# Patient Record
Sex: Male | Born: 1985 | Race: White | Hispanic: No | Marital: Married | Smoking: Never smoker
Health system: Southern US, Community
[De-identification: ages and names within clinical notes are randomized; demographics above are authoritative.]

---

## 2018-09-02 ENCOUNTER — Other Ambulatory Visit: Payer: Self-pay

## 2018-09-02 ENCOUNTER — Other Ambulatory Visit: Payer: Self-pay | Admitting: Family Medicine

## 2018-09-02 ENCOUNTER — Ambulatory Visit
Admission: RE | Admit: 2018-09-02 | Discharge: 2018-09-02 | Disposition: A | Discharge status: Home / Self Care | Payer: Federal, State, Local not specified - PPO | Source: Ambulatory Visit | Attending: Family Medicine | Admitting: Family Medicine

## 2018-09-02 ENCOUNTER — Ambulatory Visit
Admission: RE | Admit: 2018-09-02 | Discharge: 2018-09-02 | Disposition: A | Discharge status: Home / Self Care | Payer: Federal, State, Local not specified - PPO | Attending: Family Medicine | Admitting: Family Medicine

## 2018-09-02 DIAGNOSIS — G8929 Other chronic pain: Secondary | ICD-10-CM

## 2018-09-02 DIAGNOSIS — M25562 Pain in left knee: Secondary | ICD-10-CM

## 2020-11-29 IMAGING — CR LEFT KNEE - 1-2 VIEW
2 series · 2 of 2 positions shown · non-contrast
Comparison: None.

CLINICAL DATA: Chronic left knee pain for the past 20 years, worse
during the past 3 years especially since beginning new job and with
recent weight gain. No known injury.

EXAM:
LEFT KNEE - 1-2 VIEW

[knee ap]
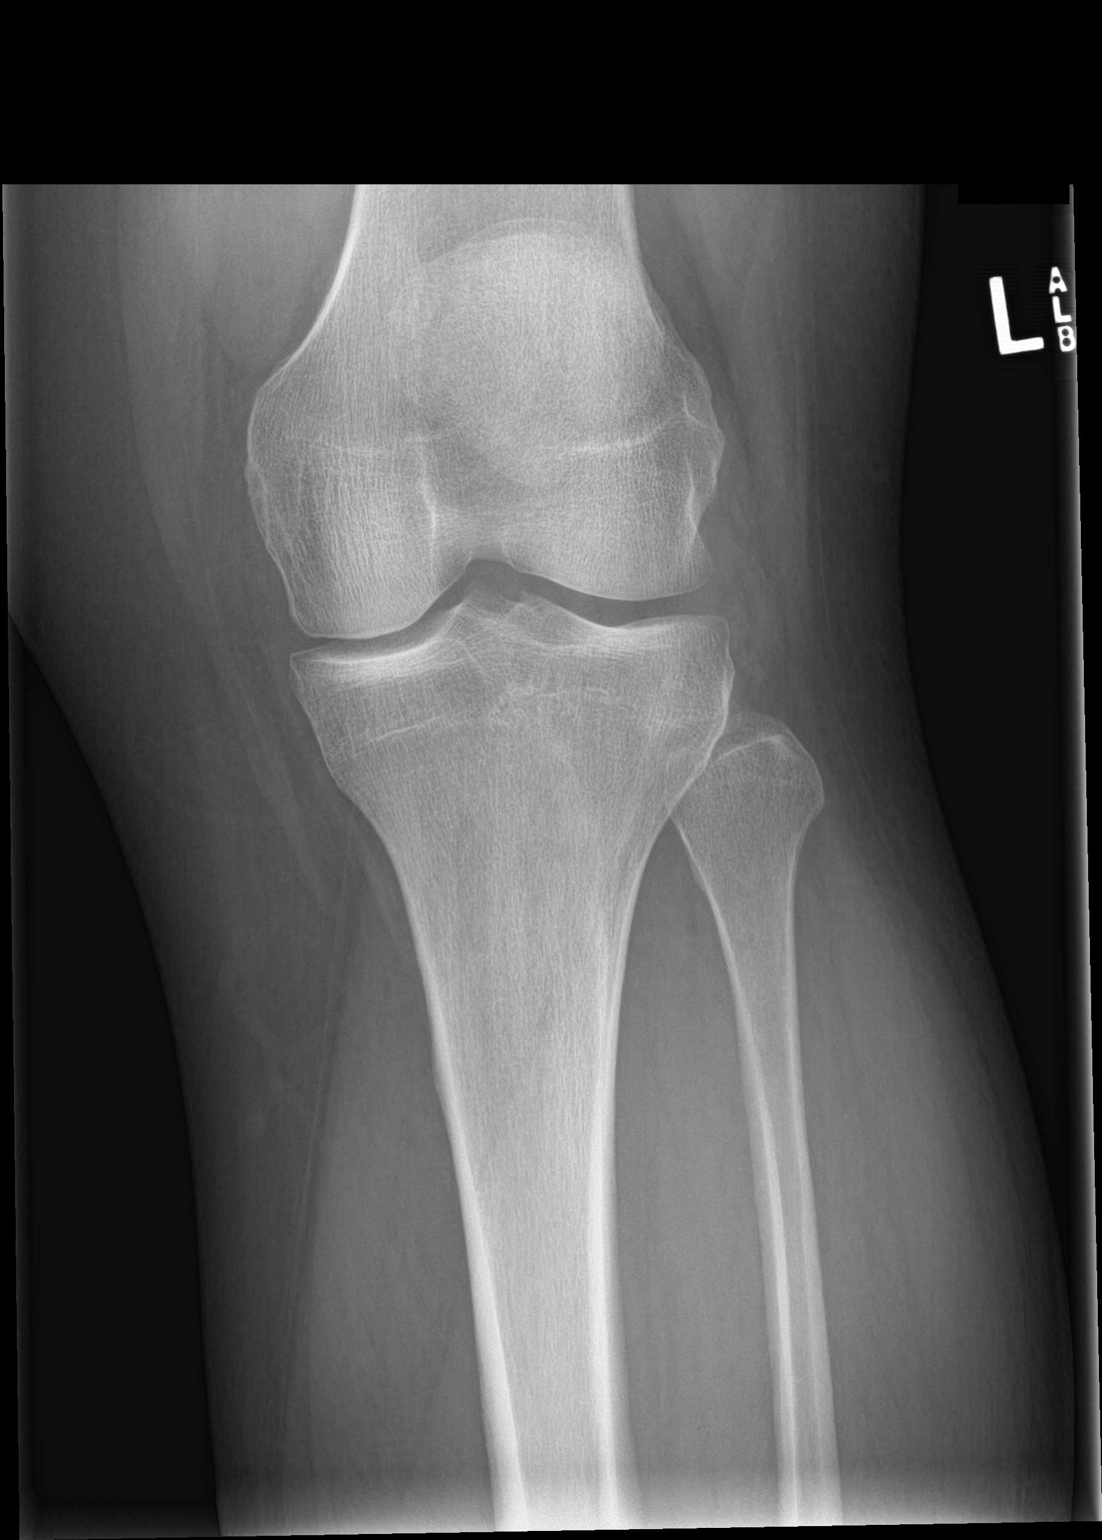

[knee lat]
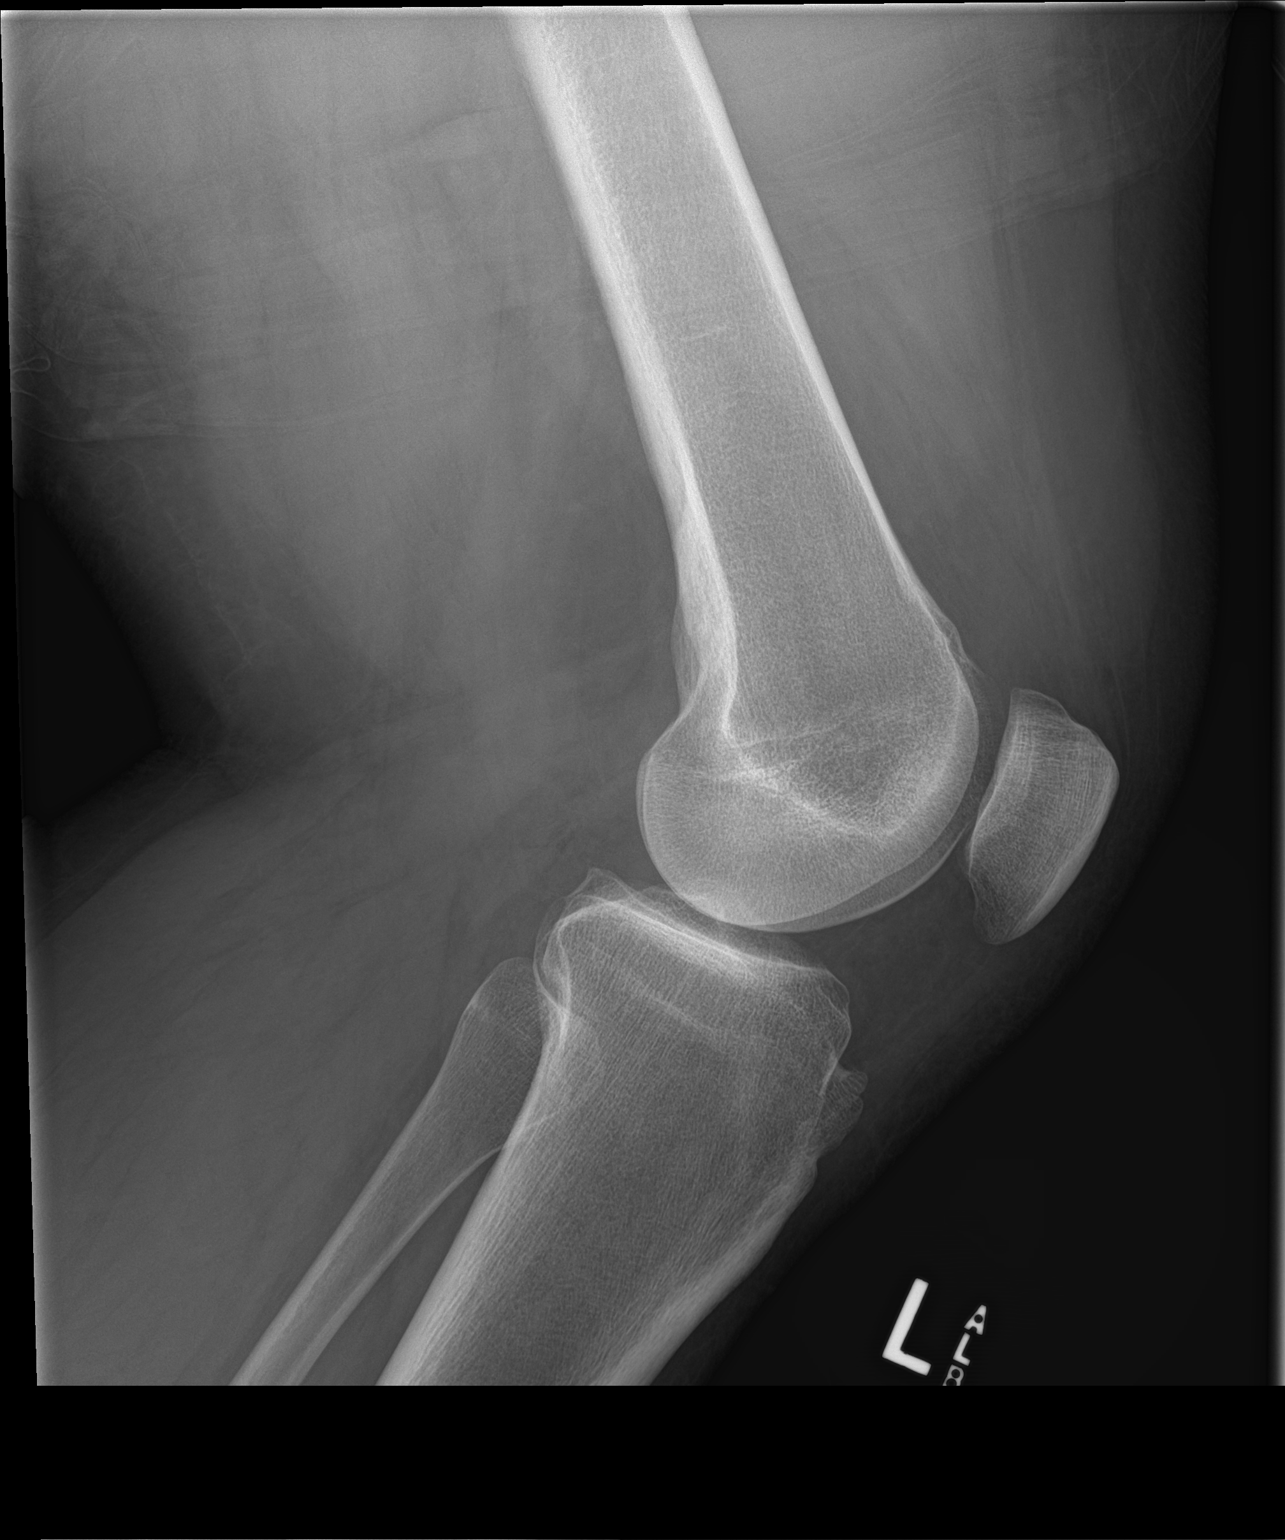

[2 of 2 positions shown; findings below may reference images not displayed]

FINDINGS: No fracture or dislocation. Joint spaces appear preserved. There is
minimal enthesopathic change involving the tibial tuberosity without
associated patella [place] or [place] deformity. No joint effusion. No
evidence of chondrocalcinosis. Regional soft tissues appear normal.
IMPRESSION: Minimal enthesopathic change involving the tibial tuberosity.
Otherwise, normal radiographs of the left knee.

## 2021-11-25 ENCOUNTER — Ambulatory Visit
Admission: EM | Admit: 2021-11-25 | Discharge: 2021-11-25 | Disposition: A | Discharge status: Home / Self Care | Payer: Federal, State, Local not specified - PPO | Attending: Family Medicine | Admitting: Family Medicine

## 2021-11-25 DIAGNOSIS — J020 Streptococcal pharyngitis: Secondary | ICD-10-CM | POA: Insufficient documentation

## 2021-11-25 LAB — GROUP A STREP BY PCR: Group A Strep by PCR: DETECTED — AB

## 2021-11-25 MED ORDER — AMOXICILLIN 875 MG PO TABS: 875.0000 mg | ORAL_TABLET | Freq: Two times a day (BID) | ORAL | 0 refills | Status: DC

## 2021-11-25 NOTE — ED Triage Notes (Signed)
Patient presents to UC for sore throat for about a week and a half.   Starting Friday he has had right ear pain.

## 2021-11-25 NOTE — ED Provider Notes (Signed)
[facility]    CSN: [id number] Arrival date & time: 11/25/21  0930      History   Chief Complaint Chief Complaint  Patient presents with   Sore Throat   Otalgia    Right    HPI Gary Obrien is a 36 y.o. male.   HPI  Gary Obrien presents for ongoing sore throat for the past week and a half.  He started having acute right ear pain on Friday.  This morning he woke up with really bad throat pain so he came into the urgent care.  He has been gargling with salt water, taking ibuprofen, taking cold and flu medicine which helps some but the pain continued to worsen.  He has otherwise been well.  His wife has a sore throat.  His son is 12 years old and is asymptomatic.   Fever : no Chills: yes Sore throat: yes Cough: no Nasal congestion : yes Rhinorrhea: yes Myalgias: yes Appetite: slighjty   Hydration: decreased  Abdominal pain: no Nausea: no Vomiting: no Sleep disturbance: no Headache: no  Neck pain: yes underneath chin      History reviewed. No pertinent past medical history.  There are no problems to display for this patient.   History reviewed. No pertinent surgical history.     Home Medications    Prior to Admission medications   Medication Sig Start Date End Date Taking? Authorizing Provider  amoxicillin (AMOXIL) 875 MG tablet Take 1 tablet (875 mg total) by mouth 2 (two) times daily. 11/25/21  Yes Katha Cabal, DO    Family History History reviewed. No pertinent family history.  Social History Social History   Tobacco Use   Smoking status: Never   Smokeless tobacco: Never  Vaping Use   Vaping Use: Every day  Substance Use Topics   Alcohol use: Not Currently   Drug use: Never     Allergies   Patient has no known allergies.   Review of Systems Review of Systems: :negative unless otherwise stated in HPI.      Physical Exam Triage Vital Signs ED Triage Vitals  Enc Vitals Group     BP 11/25/21 0940 (!) 150/85     Pulse  Rate 11/25/21 0940 93     Resp --      Temp 11/25/21 0940 98.4 F (36.9 C)     Temp Source 11/25/21 0940 Oral     SpO2 11/25/21 0940 98 %     Weight 11/25/21 0938 (!) 465 lb (210.9 kg)     Height 11/25/21 0938 6\' 3"  (1.905 m)     Head Circumference --      Peak Flow --      Pain Score 11/25/21 0938 7     Pain Loc --      Pain Edu? --      Excl. in GC? --    No data found.  Updated Vital Signs BP (!) 150/85 (BP Location: Left Arm)   Pulse 93   Temp 98.4 F (36.9 C) (Oral)   Ht 6\' 3"  (1.905 m)   Wt (!) 210.9 kg   SpO2 98%   BMI 58.12 kg/m   Visual Acuity Right Eye Distance:   Left Eye Distance:   Bilateral Distance:    Right Eye Near:   Left Eye Near:    Bilateral Near:     Physical Exam GEN:     alert, non-toxic appearing male in no distress    HENT:  mucus membranes  moist, oropharyngeal without lesions or exudate, no 3+ tonsillar hypertrophy, moderate erythema, mild turbinate hypertrophy, clear nasal discharge, bilateral TM erythematous and opaque EYES:   pupils equal and reactive, no scleral injection NECK:  normal ROM, no lymphadenopathy, no meningismus   RESP:  no increased work of breathing  CVS:   regular rate Skin:   warm and dry, no rash on visible skin    UC Treatments / Results  Labs (all labs ordered are listed, but only abnormal results are displayed) Labs Reviewed  GROUP A STREP BY PCR - Abnormal; Notable for the following components:      Result Value   Group A Strep by PCR DETECTED (*)    All other components within normal limits    EKG   Radiology No results found.  Procedures Procedures (including critical care time)  Medications Ordered in UC Medications - No data to display  Initial Impression / Assessment and Plan / UC Course  I have reviewed the triage vital signs and the nursing notes.  Pertinent labs & imaging results that were available during my care of the patient were reviewed by me and considered in my medical  decision making (see chart for details).     Strep pharyngitis Patient is a 36 year old male who presents for sore throat for the past week and a half.  On exam pharyngeal exam is erythematous but no exudates.  He does have some tonsillar hypertrophy.  Is also some evidence of acute otitis media.  Treat with amoxicillin for 10 days.  Overall patient is well-appearing, well-hydrated and without respiratory distress. He is afebrile here.  Tylenol/Motrin as needed for discomfort.  Continue gargling with warm salt water.  Recommended to avoid anything that irritates his throat.    Discussed MDM, treatment plan and plan for follow-up with patient/parent who agrees with plan.     Final Clinical Impressions(s) / UC Diagnoses   Final diagnoses:  Strep pharyngitis     Discharge Instructions      You have strep throat.  I sent antibiotics to your pharmacy.  Even if you start to feel better please complete all of your antibiotics.  Follow-up with your PCP or return to the urgent care, if not improving.  You can take Tylenol or Motrin as needed for pain or discomfort.  You performing salt water gargles.     ED Prescriptions     Medication Sig Dispense Auth. Provider   amoxicillin (AMOXIL) 875 MG tablet Take 1 tablet (875 mg total) by mouth 2 (two) times daily. 10 tablet Katha Cabal, DO      PDMP not reviewed this encounter.   Katha Cabal, DO 11/25/21 1038

## 2021-11-25 NOTE — Discharge Instructions (Signed)
You have strep throat.  I sent antibiotics to your pharmacy.  Even if you start to feel better please complete all of your antibiotics.  Follow-up with your PCP or return to the urgent care, if not improving.  You can take Tylenol or Motrin as needed for pain or discomfort.  You performing salt water gargles.

## 2022-02-28 ENCOUNTER — Ambulatory Visit
Admission: EM | Admit: 2022-02-28 | Discharge: 2022-02-28 | Disposition: A | Discharge status: Home / Self Care | Payer: Federal, State, Local not specified - PPO | Attending: Physician Assistant | Admitting: Physician Assistant

## 2022-02-28 DIAGNOSIS — J40 Bronchitis, not specified as acute or chronic: Secondary | ICD-10-CM

## 2022-02-28 DIAGNOSIS — R051 Acute cough: Secondary | ICD-10-CM | POA: Diagnosis not present

## 2022-02-28 DIAGNOSIS — R0602 Shortness of breath: Secondary | ICD-10-CM

## 2022-02-28 MED ORDER — PREDNISONE 20 MG PO TABS: 40.0000 mg | ORAL_TABLET | Freq: Every day | ORAL | 0 refills | Status: AC

## 2022-02-28 MED ORDER — PROMETHAZINE-DM 6.25-15 MG/5ML PO SYRP: 5.0000 mL | ORAL_SOLUTION | Freq: Four times a day (QID) | ORAL | 0 refills | Status: AC | PRN

## 2022-02-28 MED ORDER — AZITHROMYCIN 250 MG PO TABS: 250.0000 mg | ORAL_TABLET | Freq: Every day | ORAL | 0 refills | Status: AC

## 2022-02-28 NOTE — ED Provider Notes (Signed)
[facility]    CSN: [id number] Arrival date & time: 02/28/22  1705      History   Chief Complaint Chief Complaint  Patient presents with   Cough    HPI Gary Obrien is a 36 y.o. male presenting for 10-day history of cough and congestion.  He says his cough is dry.  He says that his chest feels congested.  He reports that over the past couple of days he started to feel short of breath with exertion while walking around [employer] where he works.  He denies any associated fevers.  He has not had any nasal congestion or sinus pain, sore throat.  He denies any pain in his chest or abdomen, vomiting or nausea.  He has been taking Mucinex here and there for his cough.  He says his symptoms may be worsening and not improving.  He has no history of asthma or bronchitis.  No other complaints.  History of sleep apnea and uses a BiPAP.  HPI  History reviewed. No pertinent past medical history.  There are no problems to display for this patient.   History reviewed. No pertinent surgical history.     Home Medications    Prior to Admission medications   Medication Sig Start Date End Date Taking? Authorizing Provider  azithromycin (ZITHROMAX) 250 MG tablet Take 1 tablet (250 mg total) by mouth daily. Take first 2 tablets together, then 1 every day until finished. 02/28/22  Yes Shirlee Latch, PA-C  predniSONE (DELTASONE) 20 MG tablet Take 2 tablets (40 mg total) by mouth daily for 5 days. 02/28/22 03/05/22 Yes Shirlee Latch, PA-C  promethazine-dextromethorphan (PROMETHAZINE-DM) 6.25-15 MG/5ML syrup Take 5 mLs by mouth 4 (four) times daily as needed. 02/28/22  Yes Shirlee Latch PA-C    Family History History reviewed. No pertinent family history.  Social History Social History   Tobacco Use   Smoking status: Never   Smokeless tobacco: Never  Vaping Use   Vaping Use: Every day  Substance Use Topics   Alcohol use: Not Currently   Drug use: Never     Allergies    Patient has no known allergies.   Review of Systems Review of Systems  Constitutional:  Negative for fatigue and fever.  HENT:  Positive for congestion and rhinorrhea. Negative for sinus pressure, sinus pain and sore throat.   Respiratory:  Positive for cough and shortness of breath.   Cardiovascular:  Negative for chest pain.  Gastrointestinal:  Negative for abdominal pain, diarrhea, nausea and vomiting.  Musculoskeletal:  Negative for myalgias.  Neurological:  Negative for weakness, light-headedness and headaches.  Hematological:  Negative for adenopathy.     Physical Exam Triage Vital Signs ED Triage Vitals  Enc Vitals Group     BP      Pulse      Resp      Temp      Temp src      SpO2      Weight      Height      Head Circumference      Peak Flow      Pain Score      Pain Loc      Pain Edu?      Excl. in GC?    No data found.  Updated Vital Signs BP (!) 153/97 (BP Location: Right Arm)   Pulse 90   Temp 98 F (36.7 C) (Oral)   Ht 6\' 3"  (1.905 m)  Wt (!) 465 lb (210.9 kg)   SpO2 95%   BMI 58.12 kg/m       Physical Exam Vitals and nursing note reviewed.  Constitutional:      General: He is not in acute distress.    Appearance: Normal appearance. He is well-developed. He is obese. He is not ill-appearing.  HENT:     Head: Normocephalic and atraumatic.     Nose: Nose normal.     Mouth/Throat:     Mouth: Mucous membranes are moist.     Pharynx: Oropharynx is clear.  Eyes:     General: No scleral icterus.    Conjunctiva/sclera: Conjunctivae normal.  Cardiovascular:     Rate and Rhythm: Normal rate and regular rhythm.     Heart sounds: Normal heart sounds.  Pulmonary:     Effort: Pulmonary effort is normal. No respiratory distress.     Breath sounds: Normal breath sounds.  Musculoskeletal:     Cervical back: Neck supple.  Skin:    General: Skin is warm and dry.     Capillary Refill: Capillary refill takes less than 2 seconds.  Neurological:      General: No focal deficit present.     Mental Status: He is alert. Mental status is at baseline.     Motor: No weakness.     Gait: Gait normal.  Psychiatric:        Mood and Affect: Mood normal.        Behavior: Behavior normal.      UC Treatments / Results  Labs (all labs ordered are listed, but only abnormal results are displayed) Labs Reviewed - No data to display  EKG   Radiology No results found.  Procedures Procedures (including critical care time)  Medications Ordered in UC Medications - No data to display  Initial Impression / Assessment and Plan / UC Course  I have reviewed the triage vital signs and the nursing notes.  Pertinent labs & imaging results that were available during my care of the patient were reviewed by me and considered in my medical decision making (see chart for details).   36 year old male presents for 10-day history of dry cough and chest congestion.  Reports shortness of breath over the past 3 to 4 days.  No fever.  History of OSA and uses a BiPAP nightly.  Vitals are stable.  He is overall well-appearing and in no acute distress.  His HEENT exam is normal.  Chest is clear to auscultation.  Suspect likely viral bronchitis.  We will treat him at this time with prednisone and Promethazine DM.  I did offer him albuterol but he declines.  Advised him that most the time these conditions are due to viruses.  Printed prescription for azithromycin in case he is not starting to improve over the next 2 days or if his symptoms worsen.  Rest and to return if he develops a fever, worsening cough or increased shortness of breath.  Advised of ED precautions.   Final Clinical Impressions(s) / UC Diagnoses   Final diagnoses:  Bronchitis  Acute cough  Shortness of breath     Discharge Instructions      -As we discussed, your symptoms are likely due to viral bronchitis.  Symptoms can last for 3 to 4 weeks sometimes.  Care is mostly supportive with  increasing rest and fluids and taking cough medication.  I have also [facility] to help open up your lungs.  I printed a prescription  for an antibiotic in case you are not starting to improve in the next few days after starting the prednisone and cough medication. - If you develop a fever or have worsening cough or increased shortness of breath, please return for reevaluation and chest x-ray.  For any acute and severe worsening of symptoms, please go to the ER.     ED Prescriptions     Medication Sig Dispense Auth. Provider   predniSONE (DELTASONE) 20 MG tablet Take 2 tablets (40 mg total) by mouth daily for 5 days. 10 tablet Shirlee Latch, PA-C   promethazine-dextromethorphan (PROMETHAZINE-DM) 6.25-15 MG/5ML syrup Take 5 mLs by mouth 4 (four) times daily as needed. 118 mL Eusebio Friendly B, PA-C   azithromycin (ZITHROMAX) 250 MG tablet Take 1 tablet (250 mg total) by mouth daily. Take first 2 tablets together, then 1 every day until finished. 6 tablet Gareth Morgan      PDMP not reviewed this encounter.   Shirlee Latch, PA-C 02/28/22 1745

## 2022-02-28 NOTE — ED Triage Notes (Signed)
Patient reports that he has a cold for about a week to a week and a half all symptoms went away except his cough.   Patient reports his cough is getting deeper.

## 2022-02-28 NOTE — Discharge Instructions (Addendum)
-  As we discussed, your symptoms are likely due to viral bronchitis.  Symptoms can last for 3 to 4 weeks sometimes.  Care is mostly supportive with increasing rest and fluids and taking cough medication.  I have also [facility] to help open up your lungs.  I printed a prescription for an antibiotic in case you are not starting to improve in the next few days after starting the prednisone and cough medication. - If you develop a fever or have worsening cough or increased shortness of breath, please return for reevaluation and chest x-ray.  For any acute and severe worsening of symptoms, please go to the ER.

## 2023-06-22 ENCOUNTER — Other Ambulatory Visit: Payer: Self-pay

## 2023-06-22 ENCOUNTER — Emergency Department: Payer: Self-pay

## 2023-06-22 ENCOUNTER — Emergency Department
Admission: EM | Admit: 2023-06-22 | Discharge: 2023-06-22 | Disposition: A | Discharge status: Home / Self Care | Payer: Self-pay | Attending: Emergency Medicine | Admitting: Emergency Medicine

## 2023-06-22 DIAGNOSIS — N23 Unspecified renal colic: Secondary | ICD-10-CM | POA: Insufficient documentation

## 2023-06-22 DIAGNOSIS — N132 Hydronephrosis with renal and ureteral calculous obstruction: Secondary | ICD-10-CM | POA: Diagnosis not present

## 2023-06-22 DIAGNOSIS — R109 Unspecified abdominal pain: Secondary | ICD-10-CM | POA: Diagnosis present

## 2023-06-22 DIAGNOSIS — N201 Calculus of ureter: Secondary | ICD-10-CM

## 2023-06-22 LAB — CBC
HCT: 42.2 % (ref 39.0–52.0)
Hemoglobin: 14.6 g/dL (ref 13.0–17.0)
MCH: 33.2 pg (ref 26.0–34.0)
MCHC: 34.6 g/dL (ref 30.0–36.0)
MCV: 95.9 fL (ref 80.0–100.0)
Platelets: 234 10*3/uL (ref 150–400)
RBC: 4.4 MIL/uL (ref 4.22–5.81)
RDW: 12.4 % (ref 11.5–15.5)
WBC: 14.3 10*3/uL — ABNORMAL HIGH (ref 4.0–10.5)
nRBC: 0 % (ref 0.0–0.2)

## 2023-06-22 LAB — HEPATIC FUNCTION PANEL
ALT: 25 U/L (ref 0–44)
AST: 19 U/L (ref 15–41)
Albumin: 4.1 g/dL (ref 3.5–5.0)
Alkaline Phosphatase: 28 U/L — ABNORMAL LOW (ref 38–126)
Bilirubin, Direct: 0.1 mg/dL (ref 0.0–0.2)
Indirect Bilirubin: 0.9 mg/dL (ref 0.3–0.9)
Total Bilirubin: 1 mg/dL (ref 0.0–1.2)
Total Protein: 7.2 g/dL (ref 6.5–8.1)

## 2023-06-22 LAB — URINALYSIS, ROUTINE W REFLEX MICROSCOPIC
Bilirubin Urine: NEGATIVE
Glucose, UA: NEGATIVE mg/dL
Ketones, ur: NEGATIVE mg/dL
Leukocytes,Ua: NEGATIVE
Nitrite: NEGATIVE
Protein, ur: NEGATIVE mg/dL
RBC / HPF: >50 RBC/hpf (ref 0–5)
Specific Gravity, Urine: 1.014 (ref 1.005–1.030)
pH: 8 (ref 5.0–8.0)

## 2023-06-22 LAB — LIPASE, BLOOD: Lipase: 34 U/L (ref 11–51)

## 2023-06-22 LAB — BASIC METABOLIC PANEL
Anion gap: 8 (ref 5–15)
BUN: 14 mg/dL (ref 6–20)
CO2: 24 mmol/L (ref 22–32)
Calcium: 9.5 mg/dL (ref 8.9–10.3)
Chloride: 105 mmol/L (ref 98–111)
Creatinine, Ser: 1.13 mg/dL (ref 0.61–1.24)
GFR, Estimated: >60 mL/min (ref >60)
Glucose, Bld: 103 mg/dL — ABNORMAL HIGH (ref 70–99)
Potassium: 3.9 mmol/L (ref 3.5–5.1)
Sodium: 137 mmol/L (ref 135–145)

## 2023-06-22 MED ORDER — TAMSULOSIN HCL 0.4 MG PO CAPS: 0.4000 mg | ORAL_CAPSULE | Freq: Every day | ORAL | 0 refills | Status: AC

## 2023-06-22 MED ORDER — ONDANSETRON HCL 4 MG/2ML IJ SOLN
4.0000 mg | Freq: Once | INTRAMUSCULAR | Status: AC
  Administered 2023-06-22: 4 mg via INTRAVENOUS
  Filled 2023-06-22: qty 2

## 2023-06-22 MED ORDER — MORPHINE SULFATE (PF) 4 MG/ML IV SOLN
4.0000 mg | Freq: Once | INTRAVENOUS | Status: AC
  Administered 2023-06-22: 4 mg via INTRAVENOUS
  Filled 2023-06-22: qty 1

## 2023-06-22 MED ORDER — HYDROCODONE-ACETAMINOPHEN 5-325 MG PO TABS: 1.0000 | ORAL_TABLET | Freq: Four times a day (QID) | ORAL | 0 refills | Status: AC | PRN

## 2023-06-22 MED ORDER — ONDANSETRON HCL 4 MG PO TABS: 4.0000 mg | ORAL_TABLET | Freq: Four times a day (QID) | ORAL | 0 refills | Status: AC | PRN

## 2023-06-22 NOTE — Discharge Instructions (Addendum)
 You were seen in the emergency department today for evaluation of your flank pain.  Your CT scan did show that you have a kidney stone that I suspect is the likely cause of your pain.  Fortunately your urine did not look infected.  You can take Tylenol and ibuprofen to help with your pain.  If you have breakthrough pain, I have sent a short course of narcotic pain medicine to your pharmacy.  This can make you drowsy, so do not drive or operate machinery when taking this.  In addition, I have sent a nausea medicine as well as a medicine that can help pass your stone called Flomax.  Follow-up with urologist in the next 1 to 2 weeks for reevaluation.  Return to the ER for new or worsening symptoms including uncontrolled pain despite pain medication, inability to tolerate food or liquids, fevers, or any other new or concerning symptoms.

## 2023-06-22 NOTE — ED Triage Notes (Signed)
 Pt comes with right sided flank pain that started last night. Pt states intense pain. Pt states hx of kidney stones and thinks this might be one. Pt states nausea.

## 2023-06-22 NOTE — ED Provider Notes (Signed)
 [facility] Provider Note    Event Date/Time   First MD Initiated Contact with Patient 06/22/23 1348     (approximate)   History   Flank Pain   HPI  Gary Obrien is a 38 year old male with history of renal stones presenting to the emergency department for evaluation of flank pain.  Pain started last night.  Reports associated nausea without vomiting.  Feels similar to prior kidney stones, but is typically able to pass these without intervention.  No fevers.     Physical Exam   Triage Vital Signs: ED Triage Vitals  Encounter Vitals Group     BP 06/22/23 1337 137/73     Systolic BP Percentile --      Diastolic BP Percentile --      Pulse Rate 06/22/23 1337 87     Resp 06/22/23 1337 18     Temp 06/22/23 1337 98 F (36.7 C)     Temp src --      SpO2 06/22/23 1337 100 %     Weight 06/22/23 1336 (!) 398 lb (180.5 kg)     Height 06/22/23 1336 6\' 3"  (1.905 m)     Head Circumference --      Peak Flow --      Pain Score 06/22/23 1336 10     Pain Loc --      Pain Education --      Exclude from Growth Chart --     Most recent vital signs: Vitals:   06/22/23 1337  BP: 137/73  Pulse: 87  Resp: 18  Temp: 98 F (36.7 C)  SpO2: 100%     General: Awake, interactive  CV:  Regular rate, good peripheral perfusion.  Resp:  Unlabored respirations, lungs clear to auscultation Abd:  Nondistended, soft, nontender to palpation, does report pain in right flank Neuro:  Symmetric facial movement, fluid speech   ED Results / Procedures / Treatments   Labs (all labs ordered are listed, but only abnormal results are displayed) Labs Reviewed  URINALYSIS, ROUTINE W REFLEX MICROSCOPIC - Abnormal; Notable for the following components:      Result Value   Color, Urine YELLOW (*)    APPearance CLOUDY (*)    Hgb urine dipstick LARGE (*)    Bacteria, UA RARE (*)    All other components within normal limits  BASIC METABOLIC PANEL - Abnormal; Notable for the  following components:   Glucose, Bld 103 (*)    All other components within normal limits  CBC - Abnormal; Notable for the following components:   WBC 14.3 (*)    All other components within normal limits  HEPATIC FUNCTION PANEL - Abnormal; Notable for the following components:   Alkaline Phosphatase 28 (*)    All other components within normal limits  LIPASE, BLOOD     EKG EKG independently reviewed interpreted by myself (ER attending) demonstrates:    RADIOLOGY Imaging independently reviewed and interpreted by myself demonstrates:  CT abdomen pelvis demonstrates obstructing 3 mm right sided renal stone at the UVJ, nonobstructing left-sided stone noted  PROCEDURES:  Critical Care performed: No  Procedures   MEDICATIONS ORDERED IN ED: Medications  morphine (PF) 4 MG/ML injection 4 mg (4 mg Intravenous Given 06/22/23 1413)  ondansetron (ZOFRAN) injection 4 mg (4 mg Intravenous Given 06/22/23 1415)     IMPRESSION / MDM / ASSESSMENT AND PLAN / ED COURSE  I reviewed the triage vital signs and the nursing notes.  Differential diagnosis  includes, but is not limited to, renal stone, UTI, pyelonephritis, lower suspicion other acute intra-abdominal process  Patient's presentation is most consistent with acute presentation with potential threat to life or bodily function.  38 year old male presenting to the emergency department for evaluation of flank pain.  Stable vitals on presentation.  Labs with leukocytosis WC 14.3, otherwise without significant derangements.  Clinically seems most consistent with renal stone, will obtain CT to further evaluate and treat symptomatically with morphine and Zofran.    Clinical Course as of 06/22/23 1622  Wynelle Link Jun 22, 2023  1514 CT demonstrated 3 mm stone.  Patient reassessed.  Pain has resolved.  Awaiting urinalysis result, called lab and they report that it is running. [NR]  1610 Urine resulted overall without evidence of infection.  Patient  without fevers.  Patient remains improved on reevaluation.  He is comfortable with discharge home.  Strict return precautions provided. [NR]    Clinical Course User Index [NR] Trinna Post, MD     FINAL CLINICAL IMPRESSION(S) / ED DIAGNOSES   Final diagnoses:  Renal colic on right side  Ureteral stone     Rx / DC Orders   ED Discharge Orders          Ordered    ondansetron (ZOFRAN) 4 MG tablet  Every 6 hours PRN        06/22/23 1622    tamsulosin (FLOMAX) 0.4 MG CAPS capsule  Daily        06/22/23 1622    HYDROcodone-acetaminophen (NORCO/VICODIN) 5-325 MG tablet  Every 6 hours PRN        06/22/23 1622             Note:  This document was prepared using Dragon voice recognition software and may include unintentional dictation errors.   Trinna Post, MD 06/22/23 [phone]
